# Patient Record
Sex: Male | Born: 1975 | Race: White | Hispanic: Yes | Marital: Married | Smoking: Current some day smoker
Health system: Southern US, Community
[De-identification: ages and names within clinical notes are randomized; demographics above are authoritative.]

---

## 2007-01-26 ENCOUNTER — Emergency Department (HOSPITAL_COMMUNITY): Admission: EM | Admit: 2007-01-26 | Discharge: 2007-01-26 | Payer: Self-pay | Admitting: Emergency Medicine

## 2009-06-01 ENCOUNTER — Emergency Department (HOSPITAL_COMMUNITY): Admission: EM | Admit: 2009-06-01 | Discharge: 2009-06-02 | Payer: Self-pay | Admitting: Emergency Medicine

## 2010-11-23 IMAGING — CR DG SHOULDER 2+V*L*
4 series · 4 of 4 positions shown · non-contrast
Comparison: 01/26/2007

CLINICAL DATA: Fall with left shoulder pain.

LEFT SHOULDER - 2+ VIEW

[w shoulder ap internal left]
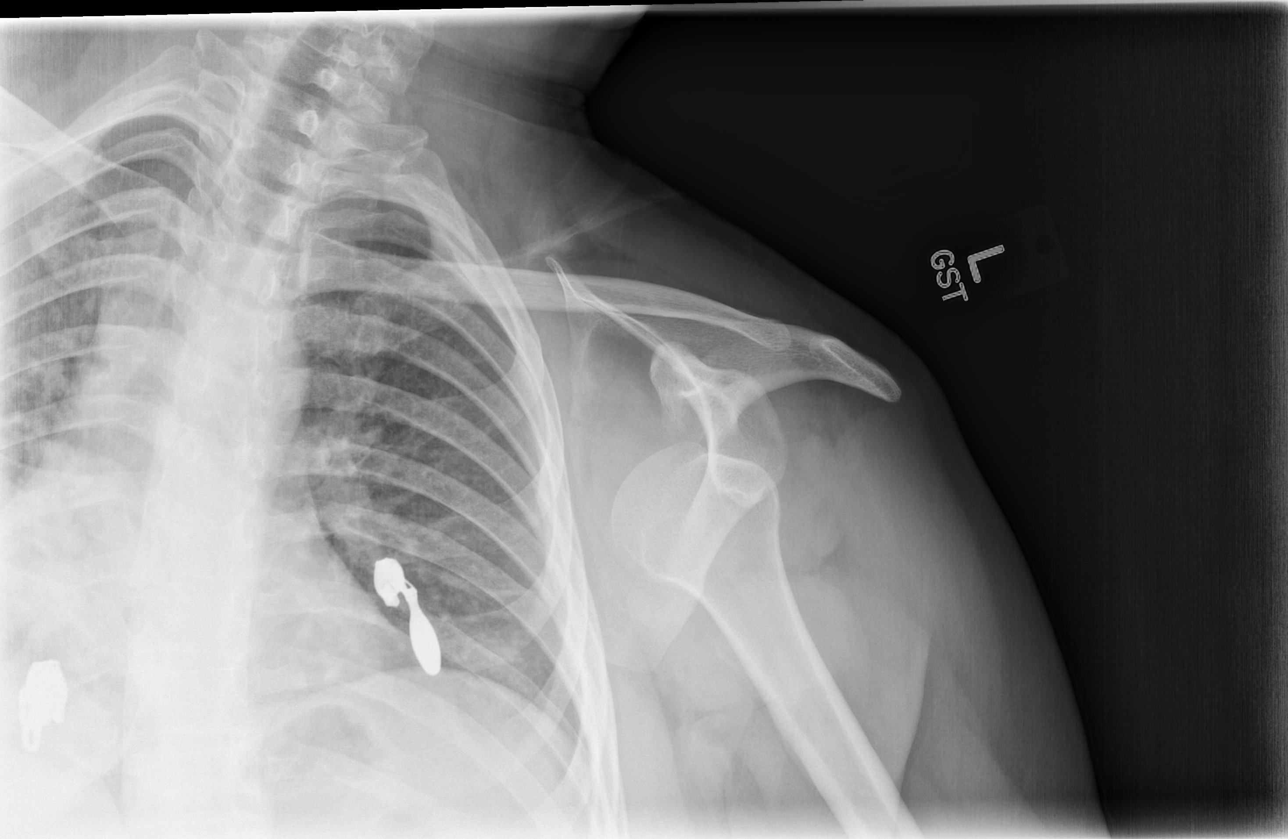

[w shoulder ap external left]
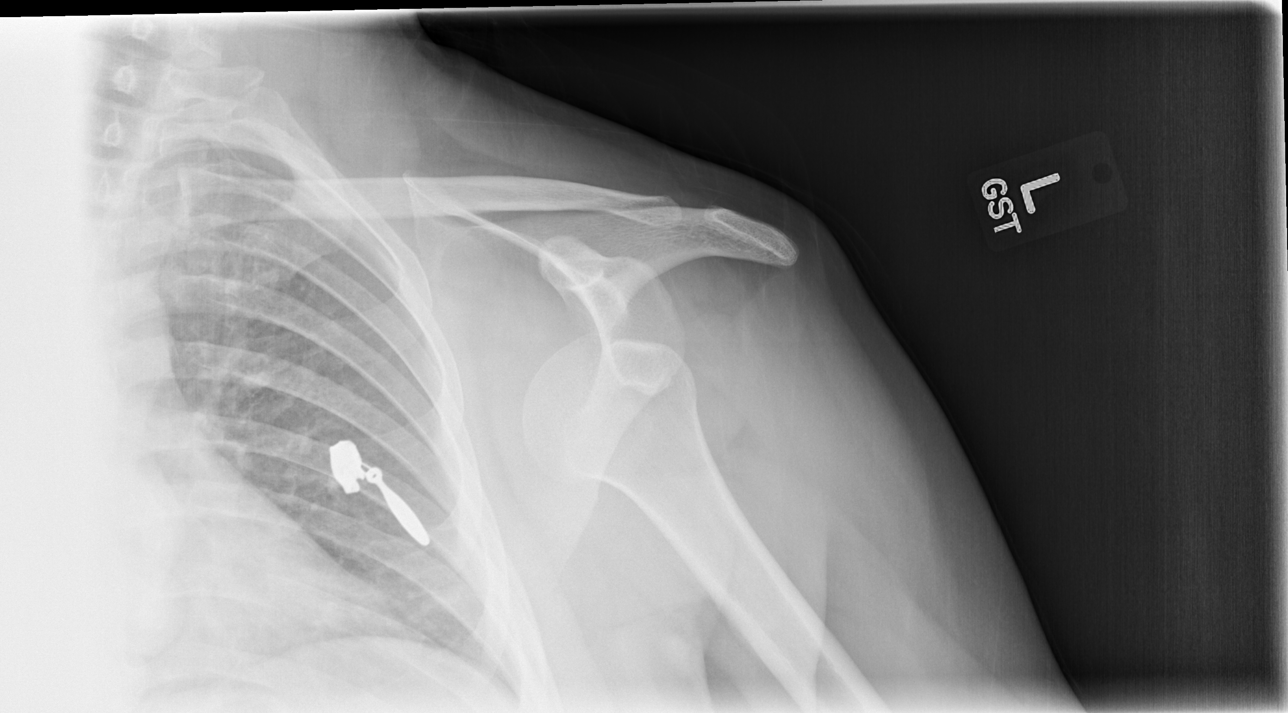

[w shoulder y view left *]
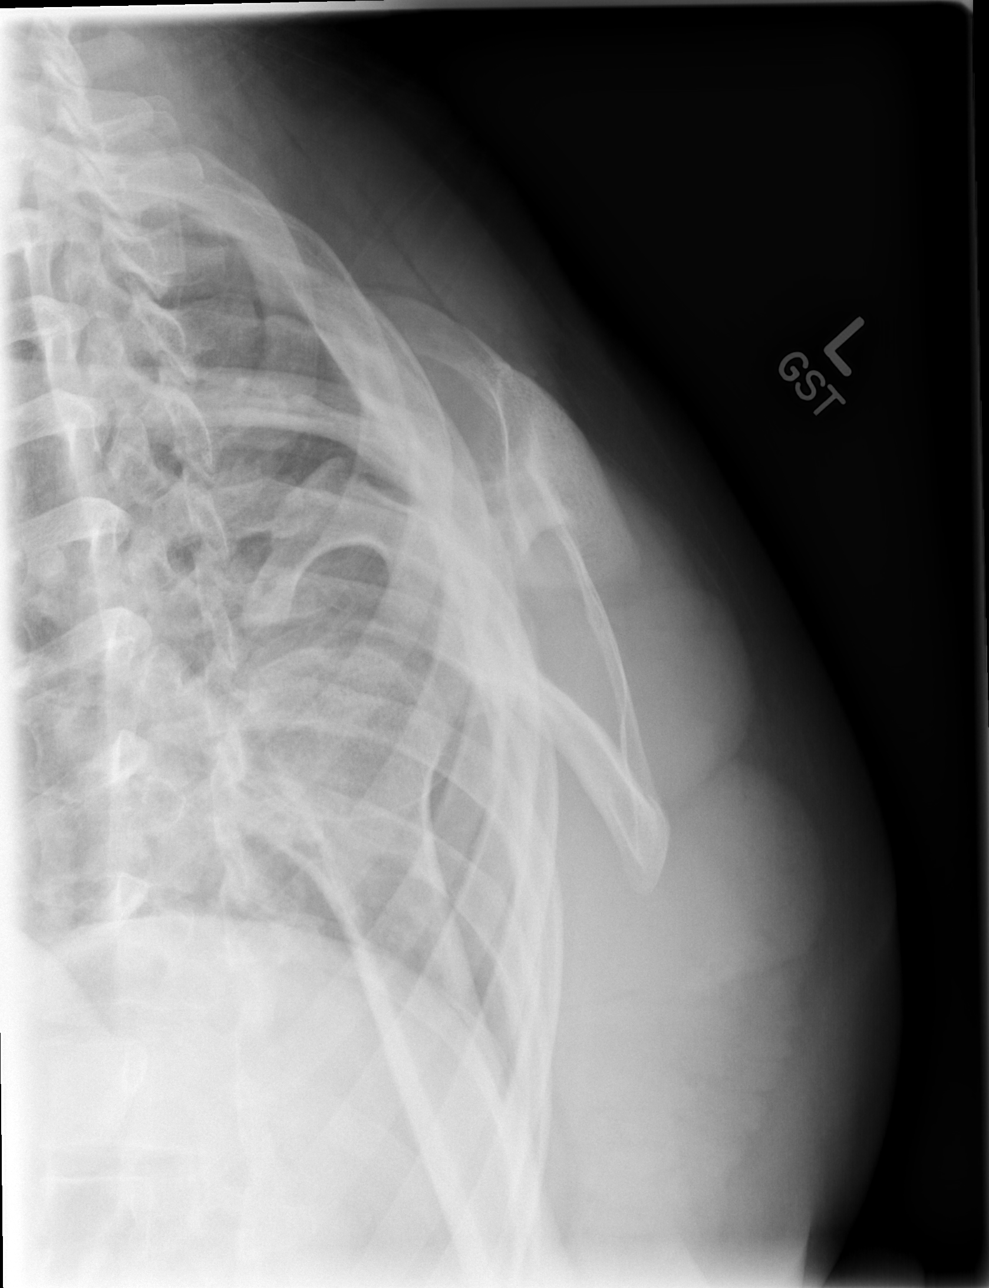

[w shoulder y view left]
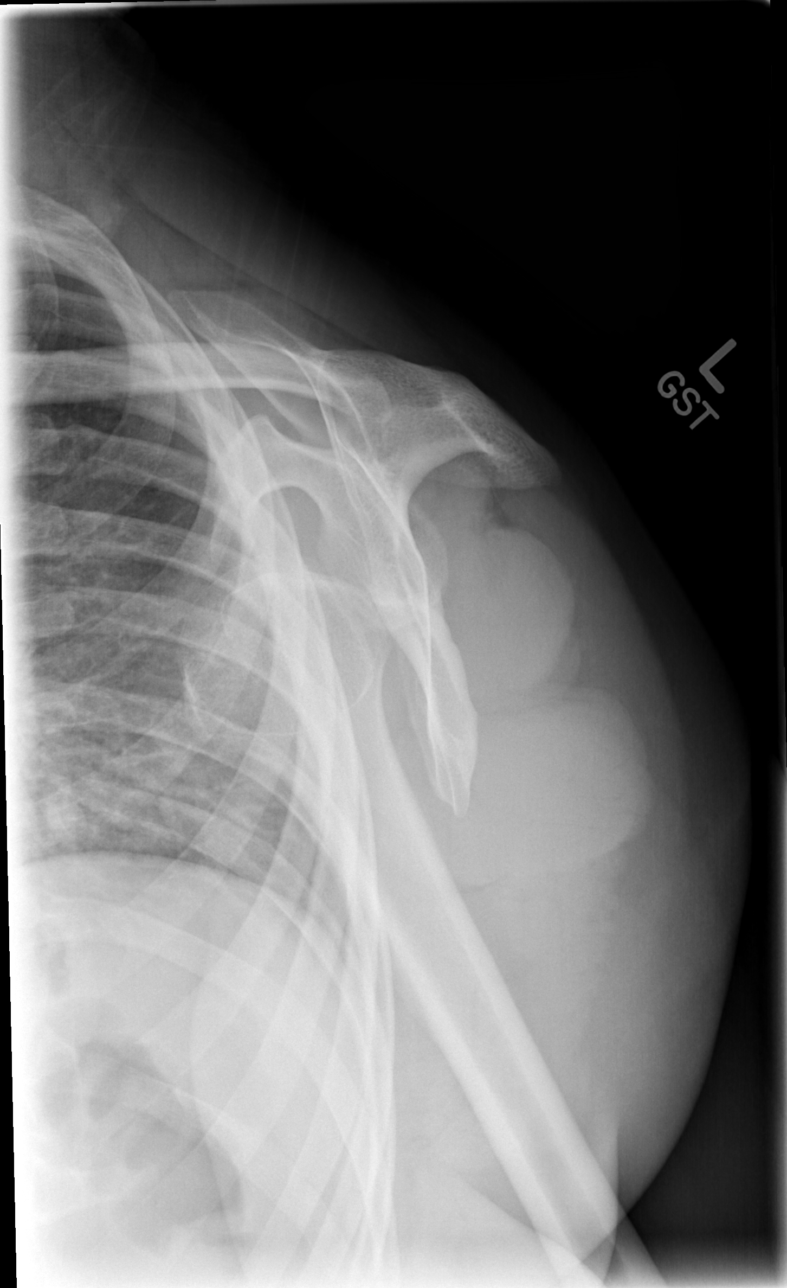

[4 of 4 positions shown; findings below may reference images not displayed]

FINDINGS: Anterior inferior sub coracoid dislocation of the left
humeral head is noted.
A Hill-Sachs deformity is again identified.
No acute fractures are present.
IMPRESSION: Anterior inferior sub coracoid left humeral head dislocation.

## 2010-11-23 IMAGING — CR DG SHOULDER 1V*L*
2 series · 2 of 2 positions shown · non-contrast
Comparison: Earlier films, same date.

CLINICAL DATA: Left shoulder dislocation.

PORTABLE LEFT SHOULDER - 2+ VIEW

[AP (1 of 2)]
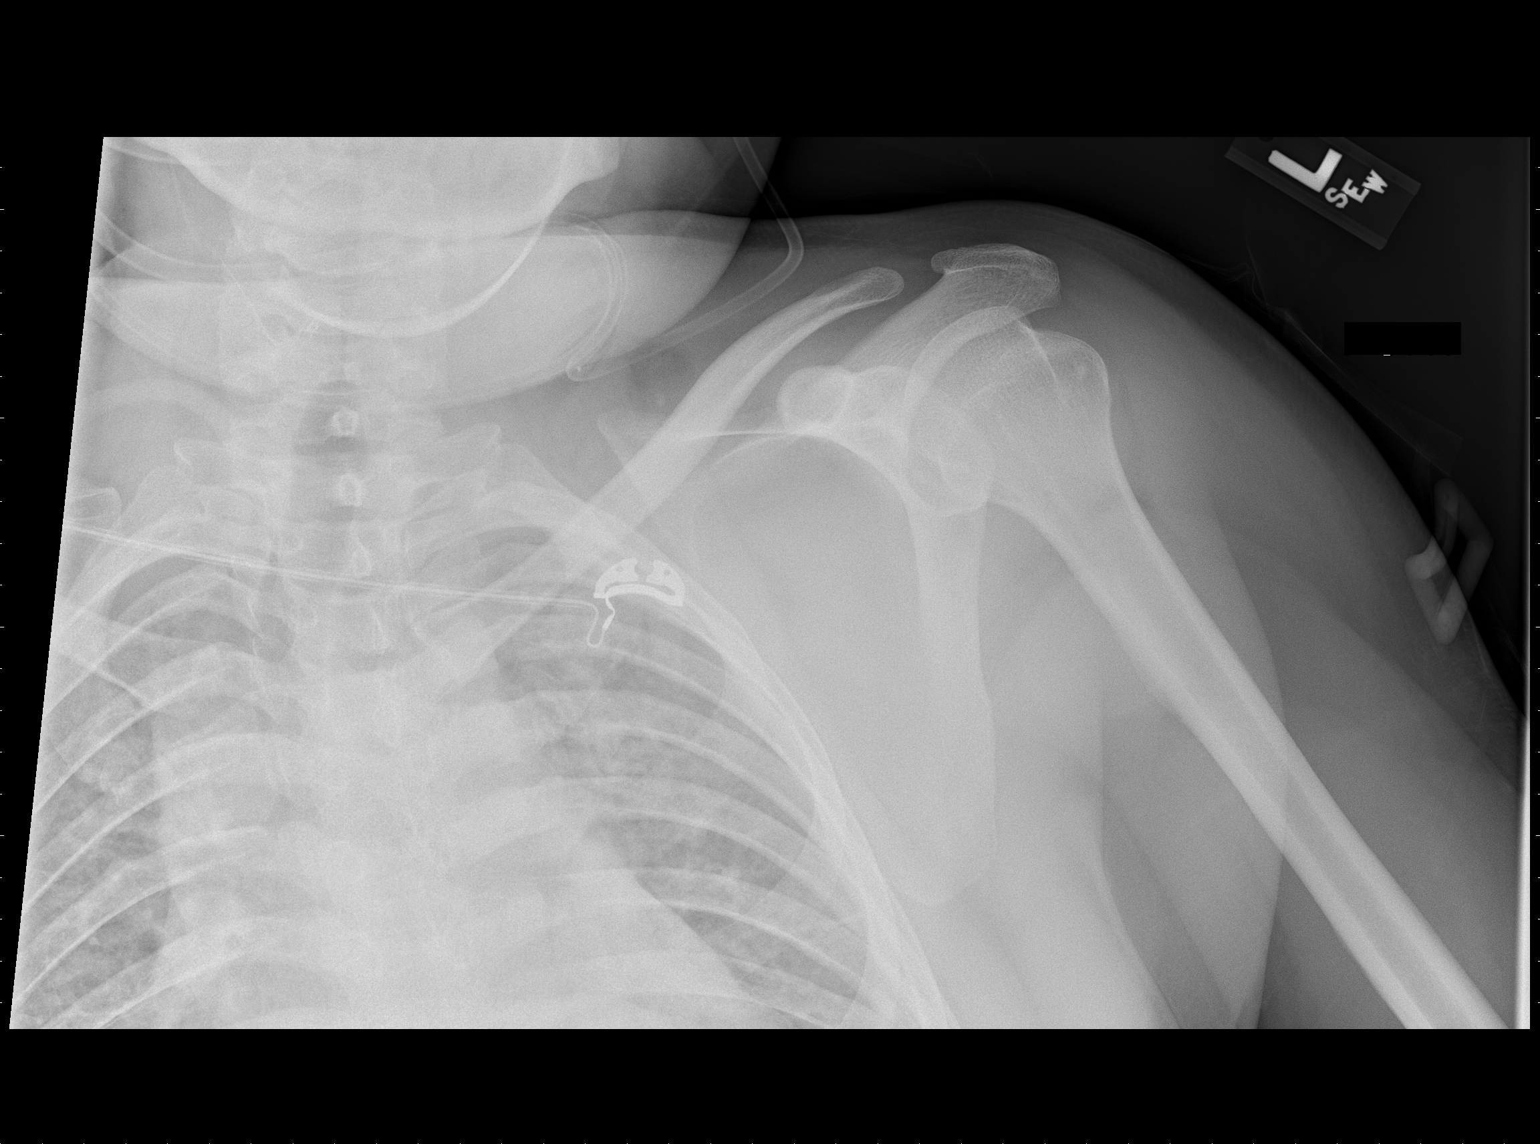

[AP (2 of 2)]
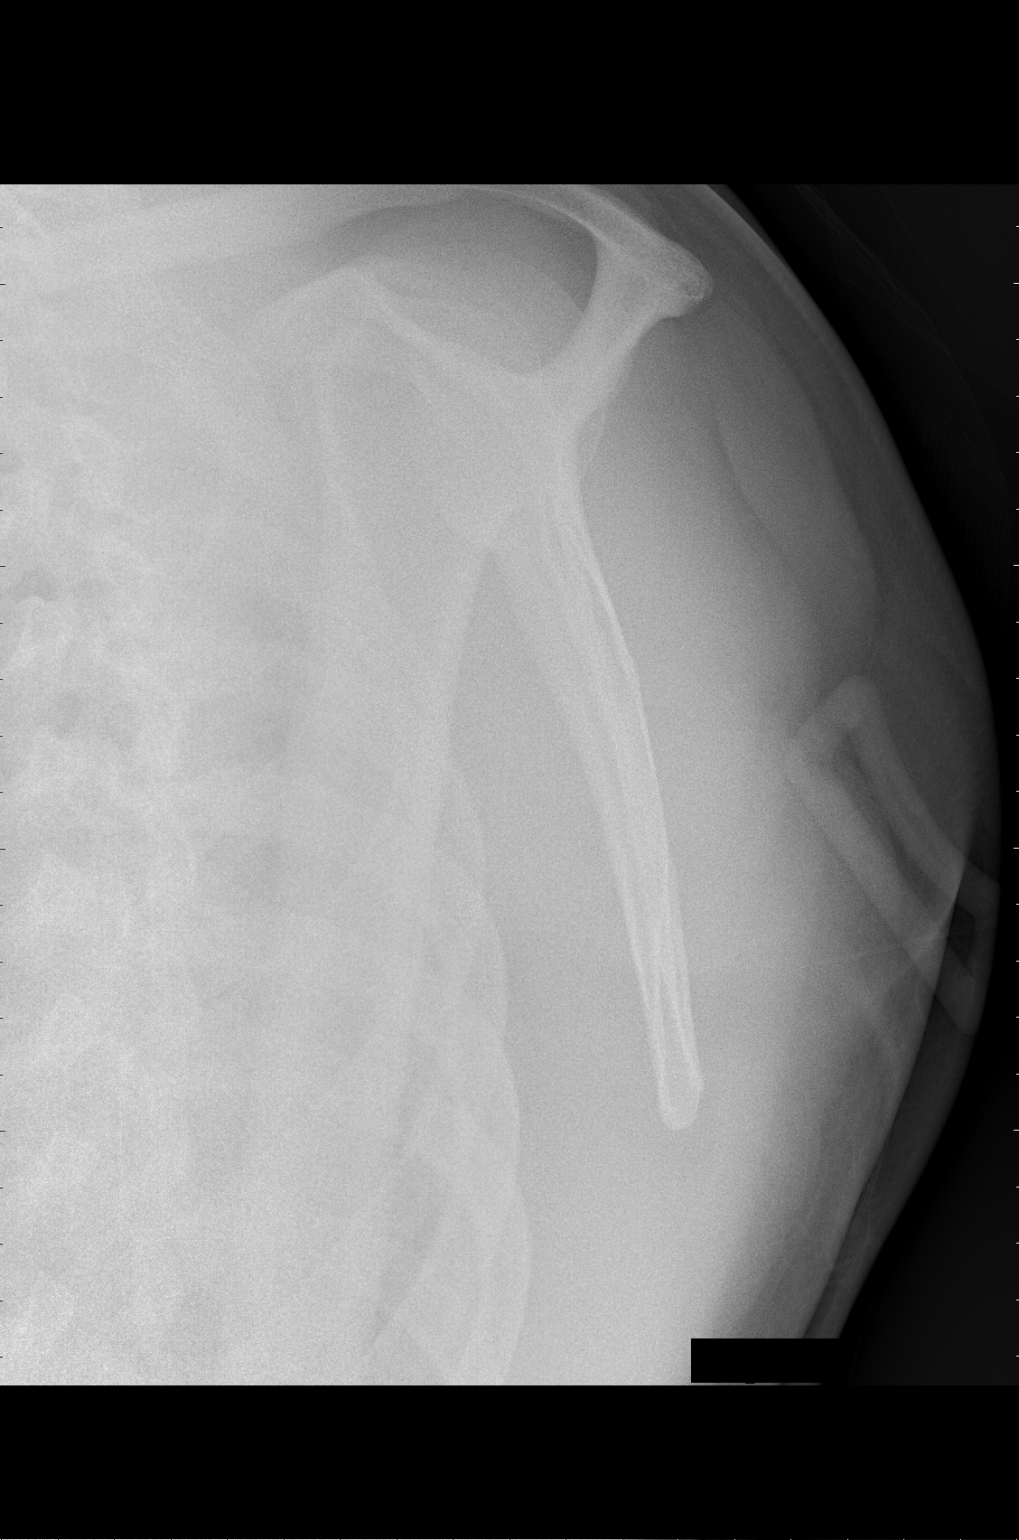

[2 of 2 positions shown; findings below may reference images not displayed]

FINDINGS: Interval reduction of left humeral head dislocation.  A
Hill-Sachs impaction type injury is noted.  No definite bony
Bankart fracture.  The AC joint is intact.
IMPRESSION: Reduction of left humeral head dislocation.

## 2011-09-02 ENCOUNTER — Other Ambulatory Visit: Payer: Self-pay | Admitting: Geriatric Medicine

## 2011-09-02 DIAGNOSIS — R1011 Right upper quadrant pain: Secondary | ICD-10-CM

## 2011-09-02 DIAGNOSIS — M549 Dorsalgia, unspecified: Secondary | ICD-10-CM

## 2011-09-09 ENCOUNTER — Other Ambulatory Visit: Payer: Self-pay | Admitting: Geriatric Medicine

## 2011-09-09 ENCOUNTER — Ambulatory Visit
Admission: RE | Admit: 2011-09-09 | Discharge: 2011-09-09 | Disposition: A | Discharge status: Home / Self Care | Payer: No Typology Code available for payment source | Source: Ambulatory Visit | Attending: Geriatric Medicine | Admitting: Geriatric Medicine

## 2011-09-09 DIAGNOSIS — R1011 Right upper quadrant pain: Secondary | ICD-10-CM

## 2011-09-09 DIAGNOSIS — M549 Dorsalgia, unspecified: Secondary | ICD-10-CM

## 2017-05-06 ENCOUNTER — Emergency Department (HOSPITAL_COMMUNITY)
Admission: EM | Admit: 2017-05-06 | Discharge: 2017-05-06 | Disposition: A | Discharge status: Home / Self Care | Payer: No Typology Code available for payment source | Attending: Physician Assistant | Admitting: Physician Assistant

## 2017-05-06 ENCOUNTER — Encounter (HOSPITAL_COMMUNITY): Payer: Self-pay | Admitting: Emergency Medicine

## 2017-05-06 DIAGNOSIS — M79601 Pain in right arm: Secondary | ICD-10-CM | POA: Insufficient documentation

## 2017-05-06 MED ORDER — ONDANSETRON 4 MG PO TBDP
4.0000 mg | ORAL_TABLET | Freq: Once | ORAL | Status: AC
  Administered 2017-05-06: 4 mg via ORAL
  Filled 2017-05-06: qty 1

## 2017-05-06 MED ORDER — OXYCODONE-ACETAMINOPHEN 5-325 MG PO TABS
2.0000 | ORAL_TABLET | Freq: Once | ORAL | Status: AC
  Administered 2017-05-06: 2 via ORAL
  Filled 2017-05-06: qty 2

## 2017-05-06 NOTE — Discharge Instructions (Signed)
Please take prescribed medicine from [facility] every 4 hours as needed for pain.  Do not take more than prescribed as this medicine can make you drowsy.  Do not drink alcohol, drive or work while taking this medicine.  Please keep splint on your arm until you are seen by orthopedic doctor.  Call orthopedic doctor on Monday for follow-up and evaluation of your fracture.  Return to the emergency department if you can no longer feel your right hand, your fingers are cold or appear white in color.  Please also return for any new or worsening symptoms.

## 2017-05-06 NOTE — Progress Notes (Signed)
Orthopedic Tech Progress Note Patient Details:  Naomie DeanLuis Ogle [birth year] [id number]  Ortho Devices Type of Ortho Device: Arm sling, Sugartong splint Ortho Device/Splint Location: lue Ortho Device/Splint Interventions: Ordered, Application, Adjustment   Trinna PostMartinez, Particia Strahm J 05/06/2017, 11:27 PM

## 2017-05-06 NOTE — ED Notes (Signed)
Ortho tech paged about sugar tong splint

## 2017-05-06 NOTE — ED Provider Notes (Signed)
[facility] EMERGENCY [facility]PART[facility] Provider Note   CSN: [id number] Arrival date & time: 05/06/17  2211     History   Chief Complaint Chief Complaint  Patient presents with  . Arm Pain    HPI Vincent Owen is a 41 y.o. male.  HPI   Vincent Owen is a 41yo male with no significant past medical history who presents to the emergency department for evaluation of ongoing left forearm pain. He is spanish speaking, telephone interpreter used for the interview. Patient states that he had a mechanical fall and hit his left wrist earlier today.  Denies hitting his head or loss of consciousness.  Was [facility] [place] and diagnosed with a comminuted distal radius fracture.  He was splinted and given pain medication to go home with.  Told to follow-up with orthopedics Monday morning.  He states that despite taking prescribed pain medicine his pain continues and is 10/10 in severity and throbbing.  He also states that for the last few hours he has had a tingling sensation in the fingers of his right hand. Denies pallor, worsening swelling, weakness, fever.   History reviewed. No pertinent past medical history.  There are no active problems to display for this patient.   History reviewed. No pertinent surgical history.     Home Medications    Prior to Admission medications   Not on File    Family History No family history on file.  Social History Social History   Tobacco Use  . Smoking status: Not on file  Substance Use Topics  . Alcohol use: Not on file  . Drug use: Not on file     Allergies   Patient has no known allergies.   Review of Systems Review of Systems  Constitutional: Negative for fever.  Musculoskeletal: Positive for arthralgias (right wrist) and joint swelling (right wrist).  Skin: Negative for pallor and wound.  Neurological: Positive for numbness (tingling in the right fingers). Negative for weakness.      Physical Exam Updated Vital Signs BP 109/64 (BP Location: Left Arm)   Pulse 64   Temp 98 F (36.7 C) (Oral)   Resp 18   SpO2 98%   Physical Exam  Constitutional: He is oriented to person, place, and time. He appears well-developed and well-nourished. No distress.  HENT:  Head: Normocephalic and atraumatic.  Eyes: Right eye exhibits no discharge. Left eye exhibits no discharge.  Pulmonary/Chest: Effort normal. No respiratory distress.  Musculoskeletal:  Sugar tong splint over the right arm removed.  Right wrist appears swollen compared to left, no ecchymosis or visible deformity. No break in the skin. Tenderness to palpation over the radial styloid process.  No ulnar tenderness.  No tenderness over the hand or fingers.  No pallor of the fingers.  Limited active ROM of the right wrist due to pain.  Full active ROM of right elbow joint. Forearm compartment soft. Radial pulses 2+.  Distal sensation to light/sharp touch intact in all 5 fingers of the right hand.  Capillary refill <2sec. All digits warm to the touch.   Neurological: He is alert and oriented to person, place, and time. Coordination normal.  Skin: Skin is warm and dry. Capillary refill takes less than 2 seconds. He is not diaphoretic.  Psychiatric: He has a normal mood and affect. His behavior is normal.  Nursing note and vitals reviewed.    ED Treatments / Results  Labs (all labs ordered are listed, but only  abnormal results are displayed) Labs Reviewed - No data to display  EKG  EKG Interpretation None       Radiology No results found.  Procedures Procedures (including critical care time)  Medications Ordered in ED Medications - No data to display   Initial Impression / Assessment and Plan / ED Course  I have reviewed the triage vital signs and the nursing notes.  Pertinent labs & imaging results that were available during my care of the patient were reviewed by me and considered in my medical  decision making (see chart for details).     Patient presents with ongoing pain after being diagnosed with a distal radius fracture earlier today.  He also endorses paresthesias in the fingers of the right hand.  On exam, right hand is neurovascularly intact.  No pallor. Radial pulses 2+, cap refill <2 sec, forearm compartment soft and sensation to light/sharp touch intact in all five fingers.  Do not suspect compartment syndrome given exam.   Have treated patient's pain in the ER today.  He has a [facility] from [facility].  Discussed that this medicine can make him drowsy, that he should not drive, work or drink alcohol while taking this medicine.  Will not give any additional pain medicine for him to go home with from here.  Splint reapplied in the ER. He has information to follow up with orthopedics on Monday. Discussed return precautions and patient agrees and voiced understanding to plan.  Discussed this patient with Dr. Corlis LeakMackuen who agrees with above plan.   Final Clinical Impressions(s) / ED Diagnoses   Final diagnoses:  Right arm pain    ED Discharge Orders    None       Kellie ShropshireShrosbree, Tremaine Earwood J, PA-C 05/07/17 0136    Abelino DerrickMackuen, Courteney Lyn, MD 05/07/17 2306

## 2017-05-06 NOTE — ED Triage Notes (Signed)
[facility] for a closed fracture of distal end of left radius.  Her was told to call the orth provider on Monday morning however he is in great pain that is unrelieved by pain medication.  Fingers are warm and he is able to move them as the arm is in a cast.

## 2017-05-09 ENCOUNTER — Ambulatory Visit: Payer: Self-pay | Admitting: Orthopedic Surgery

## 2017-05-09 MED ORDER — CEFAZOLIN SODIUM-DEXTROSE 2-4 GM/100ML-% IV SOLN
2.0000 g | INTRAVENOUS | Status: AC
  Administered 2017-05-10: 2 g via INTRAVENOUS

## 2017-05-10 ENCOUNTER — Ambulatory Visit
Admission: RE | Admit: 2017-05-10 | Discharge: 2017-05-10 | Disposition: A | Discharge status: Home / Self Care | Payer: No Typology Code available for payment source | Source: Ambulatory Visit | Attending: Orthopedic Surgery | Admitting: Orthopedic Surgery

## 2017-05-10 ENCOUNTER — Ambulatory Visit: Payer: Self-pay | Admitting: Anesthesiology

## 2017-05-10 ENCOUNTER — Encounter: Payer: Self-pay | Admitting: *Deleted

## 2017-05-10 ENCOUNTER — Other Ambulatory Visit: Payer: Self-pay

## 2017-05-10 ENCOUNTER — Encounter
Admission: RE | Disposition: A | Discharge status: Home / Self Care | Payer: Self-pay | Source: Ambulatory Visit | Attending: Orthopedic Surgery

## 2017-05-10 DIAGNOSIS — E669 Obesity, unspecified: Secondary | ICD-10-CM | POA: Insufficient documentation

## 2017-05-10 DIAGNOSIS — S52572A Other intraarticular fracture of lower end of left radius, initial encounter for closed fracture: Secondary | ICD-10-CM | POA: Insufficient documentation

## 2017-05-10 DIAGNOSIS — F17200 Nicotine dependence, unspecified, uncomplicated: Secondary | ICD-10-CM | POA: Insufficient documentation

## 2017-05-10 HISTORY — PX: OPEN REDUCTION INTERNAL FIXATION (ORIF) DISTAL RADIAL FRACTURE: SHX5989

## 2017-05-10 SURGERY — OPEN REDUCTION INTERNAL FIXATION (ORIF) DISTAL RADIUS FRACTURE
Anesthesia: General | Laterality: Left | Wound class: Clean

## 2017-05-10 MED ORDER — LIDOCAINE HCL (PF) 2 % IJ SOLN
INTRAMUSCULAR | Status: AC
  Filled 2017-05-10: qty 10

## 2017-05-10 MED ORDER — PROPOFOL 500 MG/50ML IV EMUL
INTRAVENOUS | Status: AC
  Filled 2017-05-10: qty 50

## 2017-05-10 MED ORDER — PROMETHAZINE HCL 25 MG/ML IJ SOLN: 6.2500 mg | INTRAMUSCULAR | Status: DC | PRN

## 2017-05-10 MED ORDER — MIDAZOLAM HCL 2 MG/2ML IJ SOLN
1.0000 mg | Freq: Once | INTRAMUSCULAR | Status: AC
  Administered 2017-05-10: 1 mg via INTRAVENOUS

## 2017-05-10 MED ORDER — GABAPENTIN 300 MG PO CAPS
ORAL_CAPSULE | ORAL | Status: AC
  Filled 2017-05-10: qty 1

## 2017-05-10 MED ORDER — FAMOTIDINE 20 MG PO TABS
ORAL_TABLET | ORAL | Status: AC
  Filled 2017-05-10: qty 1

## 2017-05-10 MED ORDER — ROPIVACAINE HCL 5 MG/ML IJ SOLN
INTRAMUSCULAR | Status: DC | PRN
  Administered 2017-05-10: 30 mL via PERINEURAL

## 2017-05-10 MED ORDER — MIDAZOLAM HCL 2 MG/2ML IJ SOLN
INTRAMUSCULAR | Status: DC | PRN
  Administered 2017-05-10 (×2): 1 mg via INTRAVENOUS

## 2017-05-10 MED ORDER — SODIUM CHLORIDE FLUSH 0.9 % IV SOLN
INTRAVENOUS | Status: AC
  Filled 2017-05-10: qty 10

## 2017-05-10 MED ORDER — LACTATED RINGERS IV SOLN
INTRAVENOUS | Status: DC
  Administered 2017-05-10: 11:00:00 via INTRAVENOUS

## 2017-05-10 MED ORDER — DOCUSATE SODIUM 100 MG PO CAPS: 100.0000 mg | ORAL_CAPSULE | Freq: Every day | ORAL | 2 refills | Status: AC | PRN

## 2017-05-10 MED ORDER — EPHEDRINE SULFATE 50 MG/ML IJ SOLN
INTRAMUSCULAR | Status: DC | PRN
  Administered 2017-05-10 (×4): 5 mg via INTRAVENOUS

## 2017-05-10 MED ORDER — MIDAZOLAM HCL 2 MG/2ML IJ SOLN
INTRAMUSCULAR | Status: AC
  Administered 2017-05-10: 1 mg via INTRAVENOUS
  Filled 2017-05-10: qty 2

## 2017-05-10 MED ORDER — FAMOTIDINE 20 MG PO TABS
20.0000 mg | ORAL_TABLET | Freq: Once | ORAL | Status: AC
  Administered 2017-05-10: 20 mg via ORAL

## 2017-05-10 MED ORDER — MIDAZOLAM HCL 2 MG/2ML IJ SOLN
INTRAMUSCULAR | Status: AC
  Filled 2017-05-10: qty 2

## 2017-05-10 MED ORDER — BACITRACIN 50000 UNITS IM SOLR
INTRAMUSCULAR | Status: AC
  Filled 2017-05-10: qty 1

## 2017-05-10 MED ORDER — SODIUM CHLORIDE 0.9 % IR SOLN
Status: DC | PRN
  Administered 2017-05-10: 200 mL

## 2017-05-10 MED ORDER — OXYCODONE HCL 5 MG/5ML PO SOLN: 5.0000 mg | Freq: Once | ORAL | Status: DC | PRN

## 2017-05-10 MED ORDER — FENTANYL CITRATE (PF) 100 MCG/2ML IJ SOLN: 25.0000 ug | INTRAMUSCULAR | Status: DC | PRN

## 2017-05-10 MED ORDER — MEPERIDINE HCL 50 MG/ML IJ SOLN: 6.2500 mg | INTRAMUSCULAR | Status: DC | PRN

## 2017-05-10 MED ORDER — EPHEDRINE SULFATE 50 MG/ML IJ SOLN
INTRAMUSCULAR | Status: AC
  Filled 2017-05-10: qty 1

## 2017-05-10 MED ORDER — DEXAMETHASONE SODIUM PHOSPHATE 10 MG/ML IJ SOLN
INTRAMUSCULAR | Status: AC
  Filled 2017-05-10: qty 1

## 2017-05-10 MED ORDER — PROPOFOL 500 MG/50ML IV EMUL
INTRAVENOUS | Status: DC | PRN
  Administered 2017-05-10: 100 ug/kg/min via INTRAVENOUS

## 2017-05-10 MED ORDER — FENTANYL CITRATE (PF) 100 MCG/2ML IJ SOLN
INTRAMUSCULAR | Status: AC
  Administered 2017-05-10: 50 ug via INTRAVENOUS
  Filled 2017-05-10: qty 2

## 2017-05-10 MED ORDER — ROPIVACAINE HCL 5 MG/ML IJ SOLN
INTRAMUSCULAR | Status: AC
  Filled 2017-05-10: qty 30

## 2017-05-10 MED ORDER — ONDANSETRON HCL 4 MG/2ML IJ SOLN
INTRAMUSCULAR | Status: AC
  Filled 2017-05-10: qty 2

## 2017-05-10 MED ORDER — FENTANYL CITRATE (PF) 100 MCG/2ML IJ SOLN
INTRAMUSCULAR | Status: DC | PRN
  Administered 2017-05-10: 25 ug via INTRAVENOUS

## 2017-05-10 MED ORDER — ACETAMINOPHEN 500 MG PO TABS
ORAL_TABLET | ORAL | Status: AC
  Filled 2017-05-10: qty 2

## 2017-05-10 MED ORDER — ACETAMINOPHEN 500 MG PO TABS
1000.0000 mg | ORAL_TABLET | Freq: Once | ORAL | Status: AC
  Administered 2017-05-10: 1000 mg via ORAL

## 2017-05-10 MED ORDER — LIDOCAINE HCL (PF) 1 % IJ SOLN
INTRAMUSCULAR | Status: DC | PRN
  Administered 2017-05-10: 3 mL

## 2017-05-10 MED ORDER — GABAPENTIN 300 MG PO CAPS
300.0000 mg | ORAL_CAPSULE | Freq: Once | ORAL | Status: AC
  Administered 2017-05-10: 300 mg via ORAL

## 2017-05-10 MED ORDER — OXYCODONE HCL 5 MG PO TABS: 5.0000 mg | ORAL_TABLET | Freq: Once | ORAL | Status: DC | PRN

## 2017-05-10 MED ORDER — SENNA 8.6 MG PO TABS: 1.0000 | ORAL_TABLET | Freq: Every day | ORAL | 0 refills | Status: AC

## 2017-05-10 MED ORDER — FENTANYL CITRATE (PF) 100 MCG/2ML IJ SOLN
INTRAMUSCULAR | Status: AC
  Filled 2017-05-10: qty 2

## 2017-05-10 MED ORDER — CHLORHEXIDINE GLUCONATE 4 % EX LIQD
60.0000 mL | Freq: Once | CUTANEOUS | Status: DC
Start: 2017-05-10 — End: 2017-05-10

## 2017-05-10 MED ORDER — OXYCODONE HCL 5 MG PO TABS: 5.0000 mg | ORAL_TABLET | ORAL | 0 refills | Status: AC | PRN

## 2017-05-10 MED ORDER — CEFAZOLIN SODIUM-DEXTROSE 2-4 GM/100ML-% IV SOLN
INTRAVENOUS | Status: AC
  Filled 2017-05-10: qty 100

## 2017-05-10 MED ORDER — ONDANSETRON HCL 4 MG/2ML IJ SOLN
INTRAMUSCULAR | Status: DC | PRN
  Administered 2017-05-10: 4 mg via INTRAVENOUS

## 2017-05-10 MED ORDER — LIDOCAINE HCL (PF) 1 % IJ SOLN
INTRAMUSCULAR | Status: AC
  Filled 2017-05-10: qty 5

## 2017-05-10 MED ORDER — FENTANYL CITRATE (PF) 100 MCG/2ML IJ SOLN
50.0000 ug | Freq: Once | INTRAMUSCULAR | Status: AC
  Administered 2017-05-10: 50 ug via INTRAVENOUS

## 2017-05-10 MED ORDER — BUPIVACAINE-EPINEPHRINE (PF) 0.25% -1:200000 IJ SOLN
INTRAMUSCULAR | Status: AC
  Filled 2017-05-10: qty 30

## 2017-05-10 MED ORDER — DEXAMETHASONE SODIUM PHOSPHATE 10 MG/ML IJ SOLN
INTRAMUSCULAR | Status: DC | PRN
  Administered 2017-05-10: 5 mg via INTRAVENOUS

## 2017-05-10 SURGICAL SUPPLY — 52 items
BANDAGE ELASTIC 2 CLIP ST LF (GAUZE/BANDAGES/DRESSINGS) ×3 IMPLANT
BANDAGE ELASTIC 3 CLIP ST LF (GAUZE/BANDAGES/DRESSINGS) ×3 IMPLANT
BIT DRILL 2.2 SS TIBIAL (BIT) ×3 IMPLANT
BNDG ESMARK 4X12 TAN STRL LF (GAUZE/BANDAGES/DRESSINGS) ×3 IMPLANT
BRUSH SCRUB EZ  4% CHG (MISCELLANEOUS) ×4
BRUSH SCRUB EZ 4% CHG (MISCELLANEOUS) ×2 IMPLANT
CANISTER SUCT 1200ML W/VALVE (MISCELLANEOUS) ×3 IMPLANT
CAST PADDING 3X4FT ST 30246 (SOFTGOODS) ×2
CHLORAPREP W/TINT 26ML (MISCELLANEOUS) ×3 IMPLANT
CORD BIP STRL DISP 12FT (MISCELLANEOUS) ×3 IMPLANT
CUFF TOURN 18 STER (MISCELLANEOUS) ×3 IMPLANT
CUFF TOURN 24 STER (MISCELLANEOUS) ×3 IMPLANT
DRAPE FLUOR MINI C-ARM 54X84 (DRAPES) ×3 IMPLANT
DRAPE SHEET LG 3/4 BI-LAMINATE (DRAPES) ×3 IMPLANT
DRAPE SURG 17X11 SM STRL (DRAPES) ×6 IMPLANT
ELECT REM PT RETURN 9FT ADLT (ELECTROSURGICAL) ×3
ELECTRODE REM PT RTRN 9FT ADLT (ELECTROSURGICAL) ×1 IMPLANT
FORCEPS JEWEL BIP 4-3/4 STR (INSTRUMENTS) ×3 IMPLANT
GAUZE PETRO XEROFOAM 1X8 (MISCELLANEOUS) ×3 IMPLANT
GAUZE SPONGE 4X4 12PLY STRL (GAUZE/BANDAGES/DRESSINGS) ×3 IMPLANT
GLOVE INDICATOR 8.0 STRL GRN (GLOVE) ×3 IMPLANT
GLOVE SURG ORTHO 8.0 STRL STRW (GLOVE) ×6 IMPLANT
GOWN STRL REUS W/ TWL LRG LVL3 (GOWN DISPOSABLE) ×1 IMPLANT
GOWN STRL REUS W/ TWL XL LVL3 (GOWN DISPOSABLE) ×1 IMPLANT
GOWN STRL REUS W/TWL LRG LVL3 (GOWN DISPOSABLE) ×2
GOWN STRL REUS W/TWL XL LVL3 (GOWN DISPOSABLE) ×2
K-WIRE 1.6 (WIRE) ×4
K-WIRE FX5X1.6XNS BN SS (WIRE) ×2
KIT RM TURNOVER STRD PROC AR (KITS) ×3 IMPLANT
KWIRE FX5X1.6XNS BN SS (WIRE) ×2 IMPLANT
NS IRRIG 1000ML POUR BTL (IV SOLUTION) ×3 IMPLANT
PACK EXTREMITY ARMC (MISCELLANEOUS) ×3 IMPLANT
PAD ABD DERMACEA PRESS 5X9 (GAUZE/BANDAGES/DRESSINGS) ×3 IMPLANT
PAD CAST CTTN 3X4 STRL (SOFTGOODS) ×1 IMPLANT
PEG LOCKING SMOOTH 2.2X13 (Peg) ×6 IMPLANT
PEG LOCKING SMOOTH 2.2X14 (Peg) ×3 IMPLANT
PLATE STANDARD DVR LEFT (Plate) ×3 IMPLANT
PLATE STD DVR LT 24X51 (Plate) ×1 IMPLANT
SCREW LOCK 12X2.7X 3 LD (Screw) ×2 IMPLANT
SCREW LOCK 14X2.7X 3 LD TPR (Screw) ×1 IMPLANT
SCREW LOCK 16X2.7X 3 LD TPR (Screw) ×2 IMPLANT
SCREW LOCK 18X2.7X 3 LD TPR (Screw) ×2 IMPLANT
SCREW LOCKING 2.7X12MM (Screw) ×4 IMPLANT
SCREW LOCKING 2.7X14 (Screw) ×2 IMPLANT
SCREW LOCKING 2.7X16 (Screw) ×4 IMPLANT
SCREW LOCKING 2.7X18 (Screw) ×4 IMPLANT
SPLINT CAST 1 STEP 3X12 (MISCELLANEOUS) ×3 IMPLANT
STAPLER SKIN PROX 35W (STAPLE) ×3 IMPLANT
SUT ETHILON 3 0 PS 1 (SUTURE) ×3 IMPLANT
SUT VIC AB 3-0 SH 27 (SUTURE) ×4
SUT VIC AB 3-0 SH 27X BRD (SUTURE) ×2 IMPLANT
TOWEL OR 17X26 4PK STRL BLUE (TOWEL DISPOSABLE) ×3 IMPLANT

## 2017-05-10 NOTE — Anesthesia Postprocedure Evaluation (Signed)
Anesthesia Post Note  Patient: Vincent Owen  Procedure(s) Performed: OPEN REDUCTION INTERNAL FIXATION (ORIF) DISTAL RADIAL FRACTURE (Left )  Patient location during evaluation: PACU Anesthesia Type: General Level of consciousness: awake and alert and oriented Pain management: pain level controlled Vital Signs Assessment: post-procedure vital signs reviewed and stable Respiratory status: spontaneous breathing, nonlabored ventilation and respiratory function stable Cardiovascular status: blood pressure returned to baseline and stable Postop Assessment: no signs of nausea or vomiting Anesthetic complications: no     Last Vitals:  Vitals:   05/10/17 1446 05/10/17 1501  BP: 106/71 104/68  Pulse: 64 62  Resp: 12 (!) 24  Temp: (!) 36.4 C   SpO2: 97% 95%    Last Pain:  Vitals:   05/10/17 1446  TempSrc: Temporal  PainSc:                  Sadhana Frater

## 2017-05-10 NOTE — Anesthesia Preprocedure Evaluation (Signed)
Anesthesia Evaluation  Patient identified by MRN, date of birth, ID band Patient awake    Reviewed: Allergy & Precautions, NPO status , Patient's Chart, lab work & pertinent test results  History of Anesthesia Complications Negative for: history of anesthetic complications  Airway Mallampati: II  TM Distance: >3 FB Neck ROM: Full    Dental no notable dental hx.    Pulmonary neg sleep apnea, neg COPD, Current Smoker,    breath sounds clear to auscultation- rhonchi (-) wheezing      Cardiovascular Exercise Tolerance: Good (-) hypertension(-) CAD, (-) Past MI and (-) Cardiac Stents  Rhythm:Regular Rate:Normal - Systolic murmurs and - Diastolic murmurs    Neuro/Psych negative neurological ROS  negative psych ROS   GI/Hepatic negative GI ROS, Neg liver ROS,   Endo/Other  negative endocrine ROSneg diabetes  Renal/GU negative Renal ROS     Musculoskeletal negative musculoskeletal ROS (+)   Abdominal (+) + obese,   Peds  Hematology negative hematology ROS (+)   Anesthesia Other Findings   Reproductive/Obstetrics                            Anesthesia Physical Anesthesia Plan  ASA: II  Anesthesia Plan: General   Post-op Pain Management:  Regional for Post-op pain   Induction: Intravenous  PONV Risk Score and Plan: 0 and Propofol infusion  Airway Management Planned: Natural Airway  Additional Equipment:   Intra-op Plan:   Post-operative Plan:   Informed Consent: I have reviewed the patients History and Physical, chart, labs and discussed the procedure including the risks, benefits and alternatives for the proposed anesthesia with the patient or authorized representative who has indicated his/her understanding and acceptance.   Dental advisory given  Plan Discussed with: CRNA and Anesthesiologist  Anesthesia Plan Comments:         Anesthesia Quick Evaluation

## 2017-05-10 NOTE — Anesthesia Procedure Notes (Signed)
Anesthesia Regional Block: Supraclavicular block   Pre-Anesthetic Checklist: ,, timeout performed, Correct Patient, Correct Site, Correct Laterality, Correct Procedure, Correct Position, site marked, Risks and benefits discussed,  Surgical consent,  Pre-op evaluation,  At surgeon's request and post-op pain management  Laterality: Left  Prep: chloraprep       Needles:  Injection technique: Single-shot  Needle Type: Stimiplex     Needle Length: 10cm  Needle Gauge: 21     Additional Needles:   Procedures:,,,, ultrasound used (permanent image in chart),,,,  Narrative:  Start time: 05/10/2017 11:40 AM End time: 05/10/2017 11:50 AM Injection made incrementally with aspirations every 5 mL.  Performed by: Personally  Anesthesiologist: Alver FisherPenwarden, Ora Mcnatt, MD  Additional Notes: Functioning IV was confirmed and monitors were applied.  A Stimuplex needle was used. Sterile prep and drape,hand hygiene and sterile gloves were used.  Negative aspiration and negative test dose prior to incremental administration of local anesthetic. The patient tolerated the procedure well.

## 2017-05-10 NOTE — Transfer of Care (Signed)
Immediate Anesthesia Transfer of Care Note  Patient: Vincent Owen  Procedure(s) Performed: OPEN REDUCTION INTERNAL FIXATION (ORIF) DISTAL RADIAL FRACTURE (Left )  Patient Location: PACU  Anesthesia Type:General  Level of Consciousness: awake, alert  and oriented  Airway & Oxygen Therapy: Patient spontaneously breathing, on room air  Post-op Assessment: Report given to RN and Post -op Vital signs reviewed and stable  Post vital signs: Reviewed and stable  Last Vitals:  Vitals:   05/10/17 1150 05/10/17 1446  BP: 91/62 106/71  Pulse: 67 64  Resp: 16 12  Temp:  (!) 36.4 C  SpO2: 96% 97%    Last Pain:  Vitals:   05/10/17 1446  TempSrc: Temporal  PainSc:          Complications: No apparent anesthesia complications

## 2017-05-10 NOTE — Op Note (Signed)
05/10/2017  3:14 PM  PATIENT:  Vincent Owen    PRE-OPERATIVE DIAGNOSIS:  S52.572A  Oth intartic fracture of lower end of left radius , init  POST-OPERATIVE DIAGNOSIS:  Same  PROCEDURE:  OPEN REDUCTION INTERNAL FIXATION (ORIF) DISTAL RADIAL FRACTURE  SURGEON:  Lyndle HerrlichJames R Ivi Griffith, MD   ASSISTANT: Altamese CabalMaurice Jones, PA-C  TOURNIQUET TIME:  58  MIN at 250 mmHg  ANESTHESIA:   General  PREOPERATIVE INDICATIONS:  Vincent DeanLuis Risser is a  41 y.o. male with a diagnosis of S52.572A  Oth intartic fracture of lower end of left radius , init who failed conservative measures and elected for surgical management.    The risks benefits and alternatives were discussed with the patient preoperatively including but not limited to the risks of infection, bleeding, nerve injury, malunion, nonunion, wrist stiffness, persistent wrist pain, osteoarthritis and the need for further surgery. Medical risks include but are not limited to DVT and pulmonary embolism, myocardial infarction, stroke, pneumonia, respiratory failure and death. Patient  understood these risks and wished to proceed.   OPERATIVE IMPLANTS:  hand innovations plate  OPERATIVE FINDINGS: comminuted, displaced distal radius fracture. Significant dorsal comminution. Loss of radial height. Dorsal tilt  OPERATIVE PROCEDURE: Patient was seen in the preoperative area. I marked the operative wrist according tl the hospital's correct site of surgery protocol. Patient was then brought to the operating roomand was placed supine on the operative table and underwent general anesthesia with an LMA.   The operative arm was prepped and draped in a sterile fashion. A timeout performed to verify the patient's name, date of birth, medical record number, correct site of surgery correct procedure to be performed. The timeout was also used a timeout to verify patient received antibiotics and appropriate instruments, implants and radiographs studies were available in the room.  Once all in attendance were in agreement case began.   Patient then had the operative extremity exsanguinated with an Esmarch. The tourniquet was placed on the upper extremity and inflated to 250 mm Hg.  A manual reduction of the fracture was performed. The fracture reduction was confirmed on FluoroScan imaging. The use of a traction table with finger traps was necessary to maintain adequate reduction. An assistant was required for maintaining exposure and reduction. A linear incision was then made over the FCR tendon. The subcutaneous tissue was carefully dissected using Metzenbaum scissor and Adson pickup. Retractors were used to protect the radial artery and median nerve. The pronator quadratus was identified and incised and elevated off the volar surface of the distal radius. A [employer] volar plate was then positioned on the under surface of the distal radius. It was held into position with a K wire. The position of the plate was confirmed on AP and lateral images. Once the plate was in good position a cortical screw was placed bicortically in the sliding hole. Attention was then turned to the distal pegs. The proximal row of pegs was placed first. Each individual peg hole was drilled and then measured with a depth gauge. The distal row was then drilled and smooth pegs were placed. The position and length of all screws were confirmed on AP and lateral FluoroScan imaging. Care was taken to avoid penetration of any peg through the articular surface of the distal radius.  Once all distal pegs were placed, the attention was turned back to placement of bicortical shaft screws. Additional screws were placed in the plate to fill the remaining holes. The wound was then copiously irrigated. Final  FluoroScan imaging of the construct were taken. The fracture was in anatomic position and the hardware was well-positioned. The wound again was copiously irrigated. The soft tissue was then carefully over the plate.  The skin was closed with staples. Xeroform and a dry sterile dressing were applied along with a volar splint. I was scrubbed and present for the entire case and all sharp and instrument counts were correct at the conclusion the case. The patient tolerated this procedure well and was awakened and taken to the recovery room in good condition.   Dola ArgyleJames R. Odis LusterBowers, MD

## 2017-05-10 NOTE — Anesthesia Post-op Follow-up Note (Signed)
Anesthesia QCDR form completed.        

## 2017-05-10 NOTE — Discharge Instructions (Signed)
Keep splint clean and dry May remove sling once the nerve block has worn off [facility]  AMBULATORY SURGERY  DISCHARGE INSTRUCTIONS   1) The drugs that you were given will stay in your system until tomorrow so for the next 24 hours you should not:  A) Drive an automobile B) Make any legal decisions C) Drink any alcoholic beverage   2) You may resume regular meals tomorrow.  Today it is better to start with liquids and gradually work up to solid foods.  You may eat anything you prefer, but it is better to start with liquids, then soup and crackers, and gradually work up to solid foods.   3) Please notify your doctor immediately if you have any unusual bleeding, trouble breathing, redness and pain at the surgery site, drainage, fever, or pain not relieved by medication.   4) Additional Instructions:    Please contact your physician with any problems or Same Day Surgery at [phone], Monday through Friday 6 am to 4 pm, or [place] at [facility] Main number at [phone].

## 2017-05-10 NOTE — H&P (Signed)
The patient has been re-examined, and the chart reviewed, and there have been no interval changes to the documented history and physical.  Plan a left wrist open reduction and internal fixation today.  Anesthesia is consulted regarding a peripheral nerve block for post-operative pain.  The risks, benefits, and alternatives have been discussed at length, and the patient is willing to proceed.     

## 2017-05-15 ENCOUNTER — Encounter: Payer: Self-pay | Admitting: Orthopedic Surgery

## 2017-09-11 ENCOUNTER — Ambulatory Visit: Payer: Self-pay | Admitting: Family Medicine

## 2024-07-04 ENCOUNTER — Encounter: Payer: Self-pay | Admitting: Dermatology

## 2024-07-04 ENCOUNTER — Ambulatory Visit: Payer: Self-pay | Admitting: Dermatology

## 2024-07-04 DIAGNOSIS — L739 Follicular disorder, unspecified: Secondary | ICD-10-CM

## 2024-07-04 MED ORDER — CLINDAMYCIN PHOSPHATE 1 % EX LOTN: TOPICAL_LOTION | Freq: Two times a day (BID) | CUTANEOUS | 11 refills | Status: AC

## 2024-07-04 MED ORDER — DOXYCYCLINE HYCLATE 50 MG PO CAPS: 50.0000 mg | ORAL_CAPSULE | Freq: Every day | ORAL | 3 refills | Status: AC

## 2024-07-04 NOTE — Progress Notes (Signed)
" ° °  New Patient Visit   Subjective  Vincent Owen is a 49 y.o. male, accompanied by interpreter, who presents for a NEW PATIENT appointment to be examined for the concerns as listed below.   Body Acne: Body acne presented about 15 years ago but has gotten worse over the last 3 years located at the scalp, chest, neck, upper back, B/L posterior LE. The breakouts are painful - rating the pain 10/10. He is currently not using any shower soaps and only bathing with water. Pt has not had an Rx topicals for this issue.    The following portions of the chart were reviewed this encounter and updated as appropriate: medications, allergies, medical history  Review of Systems:  No other skin or systemic complaints except as noted in HPI or Assessment and Plan.  Objective  Well appearing patient in no apparent distress; mood and affect are within normal limits.   A focused examination was performed of the following areas: scattered    Relevant exam findings are noted in the Assessment and Plan.                    Assessment & Plan   FOLLICULITIS Exam: Perifollicular erythematous papules and pustules  Chronic hidradenitis suppurativa with frequent flares, primarily affecting the breast, groin, and buttocks. Previous treatments including spironolactone, clindamycin  solution, doxycycline , and Bactrim ointment have not significantly reduced flare frequency. Smoking may exacerbate inflammation. Considering biologic therapy due to persistent symptoms and previous treatment failures. Cosentyx (secukinumab) is proposed as the next step, with potential to reduce flare frequency and severity. Discussed the mechanism of Cosentyx, its approval for HS, and the need for lab work to screen for TB and hepatitis due to immunosuppressive effects.    - Prescribed minocycline for flare management, to be taken with food. - Recommended benzoyl peroxide wash for affected areas to reduce inflammation and  bacterial load. - Ordered lab work to screen for TB and hepatitis prior to Cosentyx initiation. - Submitted prescription for [employer] pending lab results and insurance approval. - Scheduled injection training upon Cosentyx approval. - Scheduled follow-up appointment in four months to assess response to Cosentyx. FOLLICULITIS   This Visit - clindamycin  (CLEOCIN -T) 1 % lotion - Apply topically 2 (two) times daily.  Return in about 3 months (around 10/02/2024) for folliculitis .   Documentation: I have reviewed the above documentation for accuracy and completeness, and I agree with the above.  I, Shirron Maranda, CMA II, am acting as scribe for:   Delon Lenis, DO     "

## 2024-07-04 NOTE — Patient Instructions (Addendum)
 RESUMEN DE LA CONSULTA:  Kahli [place], un hombre de [address], acudi a la financial risk analyst debido a acn y erupciones cutneas recurrentes y dolorosas que afectan su cuero cabelludo, cuello, pecho, parte superior de la espalda y la parte posterior de ambas piernas. Twana bair experimentando estos sntomas cada dos semanas durante muchos aos. Los tratamientos previos con cremas y pastillas no fueron geologist, engineering, y no ha utilizado air cabin crew.  PLAN DE TRATAMIENTO:  - FOLICULITIS:  La foliculitis es una inflamacin de los folculos pilosos, a menudo causada por una infeccin bacteriana, que provoca protuberancias dolorosas e hiperpigmentacin.  Para tratarla, se le ha recetado un jabn con perxido de benzoilo al 10% para reducir las bacterias, una locin de clindamicina para doctor, general practice al da en las zonas afectadas y doxiciclina de 50 mg para tomar con las comidas y astronomer. Adems, es importante ducharse despus de sudar para eliminar el sudor y prevenir nuevas erupciones.  INSTRUCCIONES:  Por favor, acuda a una cita de seguimiento en tres meses para evaluar la efectividad del tratamiento y education officer, environmental los ajustes necesarios.  VISIT SUMMARY:  Nichoals Heyde, a 49 year old male, visited the clinic due to recurrent and painful acne and skin breakouts affecting his scalp, neck, chest, upper back, and the posterior side of both legs. He has been experiencing these symptoms every two weeks for many years. Previous treatments with creams and pills were ineffective, and he has not been using any medicated soaps recently.  YOUR PLAN:  -FOLLICULITIS: Folliculitis is an inflammation of the hair follicles, often caused by bacterial infection, leading to painful bumps and hyperpigmentation. To address this, you have been prescribed a benzoyl peroxide 10% wash to reduce bacteria, clindamycin  lotion to be applied twice daily on the affected areas, and doxycycline  50 mg to be  taken with food to prevent stomach upset. Additionally, it is important to shower after sweating to remove sweat and prevent further breakouts.  INSTRUCTIONS:  Please follow up in three months to assess the effectiveness of the treatment and make any necessary adjustments.   Important Information  Due to recent changes in healthcare laws, you may see results of your pathology and/or laboratory studies on MyChart before the doctors have had a chance to review them. We understand that in some cases there may be results that are confusing or concerning to you. Please understand that not all results are received at the same time and often the doctors may need to interpret multiple results in order to provide you with the best plan of care or course of treatment. Therefore, we ask that you please give us  2 business days to thoroughly review all your results before contacting the office for clarification. Should we see a critical lab result, you will be contacted sooner.   If You Need Anything After Your Visit  If you have any questions or concerns for your doctor, please call our main line at [phone] If no one answers, please leave a voicemail as directed and we will return your call as soon as possible. Messages left after 4 pm will be answered the following business day.   You may also send us  a message via MyChart. We typically respond to MyChart messages within 1-2 business days.  For prescription refills, please ask your pharmacy to contact our office. Our fax number is [phone].  If you have an urgent issue when the clinic is closed that cannot wait until the next business day, you can page your doctor at  the number below.    Please note that while we do our best to be available for urgent issues outside of office hours, we are not available 24/7.   If you have an urgent issue and are unable to reach us , you may choose to seek medical care at your doctor's office, retail clinic,  urgent care center, or emergency room.  If you have a medical emergency, please immediately call 911 or go to the emergency department. In the event of inclement weather, please call our main line at [phone] for an update on the status of any delays or closures.  Dermatology Medication Tips: Please keep the boxes that topical medications come in in order to help keep track of the instructions about where and how to use these. Pharmacies typically print the medication instructions only on the boxes and not directly on the medication tubes.   If your medication is too expensive, please contact our office at [phone] or send us  a message through MyChart.   We are unable to tell what your co-pay for medications will be in advance as this is different depending on your insurance coverage. However, we may be able to find a substitute medication at lower cost or fill out paperwork to get insurance to cover a needed medication.   If a prior authorization is required to get your medication covered by your insurance company, please allow us  1-2 business days to complete this process.  Drug prices often vary depending on where the prescription is filled and some pharmacies may offer cheaper prices.  The website www.goodrx.com contains coupons for medications through different pharmacies. The prices here do not account for what the cost may be with help from insurance (it may be cheaper with your insurance), but the website can give you the price if you did not use any insurance.  - You can print the associated coupon and take it with your prescription to the pharmacy.  - You may also stop by our office during regular business hours and pick up a GoodRx coupon card.  - If you need your prescription sent electronically to a different pharmacy, notify our office through [facility] or by phone at [phone]

## 2024-10-09 ENCOUNTER — Ambulatory Visit: Payer: Self-pay | Admitting: Dermatology
